# Patient Record
Sex: Female | Born: 1995 | Hispanic: Yes | Marital: Single | Smoking: Former smoker
Health system: Southern US, Community
[De-identification: ages and names within clinical notes are randomized; demographics above are authoritative.]

---
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
## 2015-01-09 ENCOUNTER — Emergency Department (HOSPITAL_COMMUNITY)
Admission: EM | Admit: 2015-01-09 | Discharge: 2015-01-09 | Disposition: A | Discharge status: Home / Self Care | Payer: Self-pay | Attending: Emergency Medicine | Admitting: Emergency Medicine

## 2015-01-09 ENCOUNTER — Encounter (HOSPITAL_COMMUNITY): Payer: Self-pay | Admitting: *Deleted

## 2015-01-09 DIAGNOSIS — Z3202 Encounter for pregnancy test, result negative: Secondary | ICD-10-CM | POA: Insufficient documentation

## 2015-01-09 DIAGNOSIS — R197 Diarrhea, unspecified: Secondary | ICD-10-CM | POA: Insufficient documentation

## 2015-01-09 DIAGNOSIS — Z720 Tobacco use: Secondary | ICD-10-CM | POA: Insufficient documentation

## 2015-01-09 DIAGNOSIS — R112 Nausea with vomiting, unspecified: Secondary | ICD-10-CM | POA: Insufficient documentation

## 2015-01-09 LAB — I-STAT BETA HCG BLOOD, ED (MC, WL, AP ONLY): I-stat hCG, quantitative: <5 m[IU]/mL (ref <5)

## 2015-01-09 LAB — URINALYSIS, ROUTINE W REFLEX MICROSCOPIC
GLUCOSE, UA: NEGATIVE mg/dL
Ketones, ur: 40 mg/dL — AB
LEUKOCYTES UA: NEGATIVE
NITRITE: NEGATIVE
PH: 6.5 (ref 5.0–8.0)
Protein, ur: NEGATIVE mg/dL
SPECIFIC GRAVITY, URINE: 1.028 (ref 1.005–1.030)
Urobilinogen, UA: 2 mg/dL — ABNORMAL HIGH (ref 0.0–1.0)

## 2015-01-09 LAB — CBC
HEMATOCRIT: 40.3 % (ref 36.0–46.0)
Hemoglobin: 13.4 g/dL (ref 12.0–15.0)
MCH: 29.6 pg (ref 26.0–34.0)
MCHC: 33.3 g/dL (ref 30.0–36.0)
MCV: 89.2 fL (ref 78.0–100.0)
Platelets: 278 10*3/uL (ref 150–400)
RBC: 4.52 MIL/uL (ref 3.87–5.11)
RDW: 13.1 % (ref 11.5–15.5)
WBC: 5.6 10*3/uL (ref 4.0–10.5)

## 2015-01-09 LAB — COMPREHENSIVE METABOLIC PANEL
ALT: 66 U/L — AB (ref 14–54)
AST: 44 U/L — ABNORMAL HIGH (ref 15–41)
Albumin: 3.7 g/dL (ref 3.5–5.0)
Alkaline Phosphatase: 55 U/L (ref 38–126)
Anion gap: 8 (ref 5–15)
BUN: 8 mg/dL (ref 6–20)
CHLORIDE: 109 mmol/L (ref 101–111)
CO2: 22 mmol/L (ref 22–32)
CREATININE: 0.73 mg/dL (ref 0.44–1.00)
Calcium: 9.3 mg/dL (ref 8.9–10.3)
GFR calc Af Amer: >60 mL/min (ref >60)
Glucose, Bld: 104 mg/dL — ABNORMAL HIGH (ref 65–99)
POTASSIUM: 3.8 mmol/L (ref 3.5–5.1)
SODIUM: 139 mmol/L (ref 135–145)
Total Bilirubin: 0.5 mg/dL (ref 0.3–1.2)
Total Protein: 7.3 g/dL (ref 6.5–8.1)

## 2015-01-09 LAB — URINE MICROSCOPIC-ADD ON

## 2015-01-09 LAB — LIPASE, BLOOD: LIPASE: 21 U/L — AB (ref 22–51)

## 2015-01-09 MED ORDER — SODIUM CHLORIDE 0.9 % IV BOLUS (SEPSIS)
1000.0000 mL | Freq: Once | INTRAVENOUS | Status: AC
  Administered 2015-01-09: 1000 mL via INTRAVENOUS

## 2015-01-09 MED ORDER — ONDANSETRON HCL 4 MG/2ML IJ SOLN
4.0000 mg | Freq: Once | INTRAMUSCULAR | Status: AC
  Administered 2015-01-09: 4 mg via INTRAVENOUS
  Filled 2015-01-09: qty 2

## 2015-01-09 MED ORDER — ONDANSETRON 4 MG PO TBDP: 4.0000 mg | ORAL_TABLET | Freq: Three times a day (TID) | ORAL | Status: DC | PRN

## 2015-01-09 NOTE — ED Provider Notes (Signed)
CSN: [id number]     Arrival date & time 01/09/15  7829 History   First MD Initiated Contact with Patient 01/09/15 [phone]     Chief Complaint  Patient presents with  . Emesis    HPI   Dana Tapia is a 19 y.o. female with no pertinent past medical history who presents to the ED with vomiting. She states she has been vomiting intermittently over the last 2 weeks, but that she has been vomiting daily since Friday. She states she wakes up at around 3 or 4 in the morning and vomits. She reports she saw streaks of blood in her emesis today. She denies fever, chills, chest pain, shortness of breath. She states she has not had anything to eat or drink over the past few days, because she cannot "keep it down." She also reports diarrhea. She denies melena or hematochezia. She denies dysuria, urgency, frequency, vaginal discharge. She denies alcohol use. She states she uses marijuana occasionally, her last use was yesterday.   History reviewed. No pertinent past medical history. History reviewed. No pertinent past surgical history. No family history on file. Social History  Substance Use Topics  . Smoking status: Current Some Day Smoker  . Smokeless tobacco: None  . Alcohol Use: No   OB History    No data available      Review of Systems  Constitutional: Negative for fever and chills.  Respiratory: Negative for shortness of breath.   Cardiovascular: Negative for chest pain.  Gastrointestinal: Positive for nausea, vomiting and diarrhea. Negative for abdominal pain, constipation and blood in stool.  Genitourinary: Negative for dysuria, urgency, frequency and vaginal discharge.  All other systems reviewed and are negative.     Allergies  Review of patient's allergies indicates no known allergies.  Home Medications   Prior to Admission medications   Medication Sig Start Date End Date Taking? Authorizing Provider  benzonatate (TESSALON) 100 MG capsule Take 1 capsule (100 mg total) by mouth  every 8 (eight) hours. 08/10/14   Ladona Mow, PA-C  ondansetron (ZOFRAN) 4 MG tablet Take 1 tablet (4 mg total) by mouth every 8 (eight) hours as needed for nausea or vomiting. 08/10/14   Ladona Mow, PA-C    BP 137/93 mmHg  Pulse 78  Temp(Src) 98.5 F (36.9 C) (Oral)  Resp 18  SpO2 98%  LMP 01/02/2015 Physical Exam  Constitutional: She is oriented to person, place, and time. She appears well-developed and well-nourished. No distress.  HENT:  Head: Normocephalic and atraumatic.  Right Ear: External ear normal.  Left Ear: External ear normal.  Nose: Nose normal.  Mouth/Throat: Uvula is midline, oropharynx is clear and moist and mucous membranes are normal.  Eyes: Conjunctivae, EOM and lids are normal. Pupils are equal, round, and reactive to light. Right eye exhibits no discharge. Left eye exhibits no discharge. No scleral icterus.  Neck: Normal range of motion. Neck supple.  Cardiovascular: Normal rate, regular rhythm, normal heart sounds, intact distal pulses and normal pulses.   Pulmonary/Chest: Effort normal and breath sounds normal. No respiratory distress. She has no wheezes. She has no rales.  Abdominal: Soft. Normal appearance and bowel sounds are normal. She exhibits no distension and no mass. There is no tenderness. There is no rigidity, no rebound and no guarding.  Musculoskeletal: Normal range of motion. She exhibits no edema or tenderness.  Neurological: She is alert and oriented to person, place, and time. She has normal strength. No cranial nerve deficit or sensory deficit.  Skin: Skin is warm, dry and intact. No rash noted. She is not diaphoretic. No erythema. No pallor.  Psychiatric: She has a normal mood and affect. Her speech is normal and behavior is normal. Judgment and thought content normal.  Nursing note and vitals reviewed.   ED Course  Procedures (including critical care time)  Labs Review Labs Reviewed  LIPASE, BLOOD - Abnormal; Notable for the following:     Lipase 21 (*)    All other components within normal limits  COMPREHENSIVE METABOLIC PANEL - Abnormal; Notable for the following:    Glucose, Bld 104 (*)    AST 44 (*)    ALT 66 (*)    All other components within normal limits  URINALYSIS, ROUTINE W REFLEX MICROSCOPIC (NOT AT [facility]) - Abnormal; Notable for the following:    Color, Urine AMBER (*)    APPearance CLOUDY (*)    Hgb urine dipstick MODERATE (*)    Bilirubin Urine SMALL (*)    Ketones, ur 40 (*)    Urobilinogen, UA 2.0 (*)    All other components within normal limits  URINE MICROSCOPIC-ADD ON - Abnormal; Notable for the following:    Squamous Epithelial / LPF MANY (*)    Bacteria, UA MANY (*)    All other components within normal limits  CBC  I-STAT BETA HCG BLOOD, ED (MC, WL, AP ONLY)    Imaging Review No results found.   I have personally reviewed and evaluated these lab results as part of my medical decision-making.   EKG Interpretation None      MDM   Final diagnoses:  Non-intractable vomiting with nausea, vomiting of unspecified type  Diarrhea, unspecified type    19 year old female presents with vomiting and diarrhea. States her vomiting started 2 weeks ago, but has been more consistent since Friday. Patient was seen for the same symptoms in May, at which time her symptoms were attributed to viral gastroenteritis.  Patient is afebrile with stable vitals. Mucous membranes are moist. Heart regular rate and rhythm. Lungs clear to auscultation bilaterally. Abdomen soft, nontender, nondistended with no rebound, guarding, or masses.    CBC negative for leukocytosis or anemia. UA with moderate hemoglobin and ketones. 3-6 RBC on microscopic. Beta hCG negative. Lipase within normal limits. CMP remarkable for AST 44, LT 66.  Will give IV fluids and zofran for nausea.  Patient able to tolerate PO trial in the ED, and reports symptom improvement with nausea medicine. Symptoms most likely viral, will treat  symptomatically with zofran. Patient to follow up with PCP in 2-3 days for repeat hepatic function panel.  BP 122/70 mmHg  Pulse 72  Temp(Src) 98.5 F (36.9 C) (Oral)  Resp 14  SpO2 100%  LMP 01/02/2015     Mady Gemma, PA-C 01/09/15 2255  Arby Barrette, MD 01/19/15 1320

## 2015-01-09 NOTE — Discharge Instructions (Signed)
1. Medications: zofran, usual home medications 2. Treatment: rest, drink plenty of fluids 3. Follow Up: please followup with your primary doctor in 2-3 days for discussion of your diagnoses and further evaluation after today's visit and for repeat hepatic function panel; if you do not have a primary care doctor use the resource guide provided to find one; please return to the ER for high fever, abdominal pain, persistent vomiting, new or worsening symptoms   Nausea and Vomiting Nausea means you feel sick to your stomach. Throwing up (vomiting) is a reflex where stomach contents come out of your mouth. HOME CARE   Take medicine as told by your doctor.  Do not force yourself to eat. However, you do need to drink fluids.  If you feel like eating, eat a normal diet as told by your doctor.  Eat rice, wheat, potatoes, bread, lean meats, yogurt, fruits, and vegetables.  Avoid high-fat foods.  Drink enough fluids to keep your pee (urine) clear or pale yellow.  Ask your doctor how to replace body fluid losses (rehydrate). Signs of body fluid loss (dehydration) include:  Feeling very thirsty.  Dry lips and mouth.  Feeling dizzy.  Dark pee.  Peeing less than normal.  Feeling confused.  Fast breathing or heart rate. GET HELP RIGHT AWAY IF:   You have blood in your throw up.  You have black or bloody poop (stool).  You have a bad headache or stiff neck.  You feel confused.  You have bad belly (abdominal) pain.  You have chest pain or trouble breathing.  You do not pee at least once every 8 hours.  You have cold, clammy skin.  You keep throwing up after 24 to 48 hours.  You have a fever. MAKE SURE YOU:   Understand these instructions.  Will watch your condition.  Will get help right away if you are not doing well or get worse. Document Released: 09/11/2007 Document Revised: 06/17/2011 Document Reviewed: 08/24/2010 [facility] Patient Information 2015 [place], [place].  This information is not intended to replace advice given to you by your health care provider. Make sure you discuss any questions you have with your health care provider.   Emergency Department Resource Guide 1) Find a Doctor and Pay Out of Pocket Although you won't have to find out who is covered by your insurance plan, it is a good idea to ask around and get recommendations. You will then need to call the office and see if the doctor you have chosen will accept you as a new patient and what types of options they offer for patients who are self-pay. Some doctors offer discounts or will set up payment plans for their patients who do not have insurance, but you will need to ask so you aren't surprised when you get to your appointment.  2) Contact Your Local Health Department Not all health departments have doctors that can see patients for sick visits, but many do, so it is worth a call to see if yours does. If you don't know where your local health department is, you can check in your phone book. The CDC also has a tool to help you locate your state's health department, and many state websites also have listings of all of their local health departments.  3) Find a Walk-in Clinic If your illness is not likely to be very severe or complicated, you may want to try a walk in clinic. These are popping up all over the country in pharmacies, drugstores, and  shopping centers. They're usually staffed by nurse practitioners or physician assistants that have been trained to treat common illnesses and complaints. They're usually fairly quick and inexpensive. However, if you have serious medical issues or chronic medical problems, these are probably not your best option.  No Primary Care Doctor: - Call Health Connect at  [phone] - they can help you locate a primary care doctor that  accepts your insurance, provides certain services, etc. - Physician Referral Service- [phone]  Chronic Pain  Problems: Organization         Address  Phone   Notes  [facility]  [phone] Patients need to be referred by their primary care doctor.   Medication Assistance: Organization         Address  Phone   Notes  [facility] Medication [facility] [address] [place], [place] [address] [phone] --Must be a resident of [facility] -- Must have NO insurance coverage whatsoever (no Medicaid/ Medicare, etc.) -- The pt. MUST have a primary care doctor that directs their care regularly and follows them in the community   Med[facility]ist  [phone]   [employer]  [phone]    Agencies that provide inexpensive medical care: Organization         Address  Phone   Notes  [place]  [phone]   [facility]    [phone]   [facility] [place], [place] [address] [phone]   [place] [address], [place] [phone]   Planned Parenthood    [phone]   [facility]    [phone]   [place] and [place] [address], [place] Phone:  [phone], Fax:  [phone] Hours of Operation:  9 am - 6 pm, M-F.  Also accepts Medicaid/Medicare and self-pay.  [facility] for [place], [address], [place] Phone: [phone], Fax: [phone]. Hours of Operation:  8:30 am - 5:30 pm, M-F.  Also accepts Medicaid and self-pay.  [facility] [address] [place] Phone: [phone]   [place], [place], [place] [phone], Ext. [phone] Mondays & Thursdays: 7-9 AM.  First 15 patients are seen on a first come, first serve basis.    [place] Providers:  Organization         Address  Phone   Notes  [facility] [address],  [phone] Also  accepts self-pay patients.  [facility] [address], [place]  [phone]   [place], [address], [place] [phone]   [facility] [address], [place] [phone]   Lucianne Lei [address], [place]   [phone] Only accepts [place] Access [place] patients after they have their name applied to their card.   Self-Pay (no insurance) in [facility]:  Organization         Address  Phone   Notes  Sickle Cell Patients, [facility] [place] [phone]   [facility] [place] [phone]   [facility] [place]  [place] [address] [place], [place] [phone]  [facility]/Dr. Osei-Bonsu  [address] [place] or [address], [place] [phone] Phone number for both [place] and [place] locations is the same.  Urgent Medical and [facility] [address] [place] [phone]   [facility] [address] [place] or [address] [phone]   [facility] [address], [place] [phone], phone; [phone], fax Sees patients 1st and 3rd Saturday of every month.  Must not qualify for public or private insurance (i.e. Medicaid, Medicare, [place] Health Choice, Veterans' Benefits)  Household income should be no more than 200% of the poverty level The clinic cannot treat you if you are pregnant or think you are pregnant  Sexually transmitted diseases are not treated at the clinic.    Dental Care: Organization         Address  Phone  Notes  [facility] Department of [facility] [place] [phone] Accepts children up to age 63 who are enrolled in [place] or [place]; pregnant  women with a Medicaid card; and children who have applied for Medicaid or [place] Health Choice, but were declined, whose parents can pay a reduced fee at time of service.  [facility] Department of [facility]  [address], [place] [phone] Accepts children up to age 22 who are enrolled in [place] or [place]; pregnant women with a Medicaid card; and children who have applied for Medicaid or [place] Health Choice, but were declined, whose parents can pay a reduced fee at time of service.  [place] Adult Dental Access PROGRAM  [place] [phone] Patients are seen by appointment only. Walk-ins are not accepted. [place] will see patients 74 years of age and older. Monday - Tuesday (8am-5pm) Most Wednesdays (8:30-5pm) $30 per visit, cash only  [facility] Adult Dental Access PROGRAM  [address], [facility] [phone] Patients are seen by appointment only. Walk-ins are not accepted. [place] will see patients 69 years of age and older. One Wednesday Evening (Monthly: Volunteer Based).  $30 per visit, cash only  [place]  [phone] for adults; Children under age 73, call Graduate Pediatric Dentistry at [phone]. Children aged 62-14, please call [phone] to request a pediatric application.  Dental services are provided in all areas of dental care including fillings, crowns and bridges, complete and partial dentures, implants, gum treatment, root canals, and extractions. Preventive care is also provided. Treatment is provided to both adults and children. Patients are selected via a lottery and there is often a waiting list.   [facility] [address] [place]  [phone] [email or web address]   [facility] [address] [place], [place] [phone], Ext. [phone] Second and Fourth Thursday of each month, opens at 6:30 AM; Clinic ends at 9 AM.  Patients are  seen on a first-come first-served basis, and a limited number are seen during each clinic.   [facility]  [address] [place]ard Danker [place], [place] [phone]   Eligibility Requirements You must have lived in [place], [place], or [place] counties for at least the last three months.   You cannot be eligible for state or federal sponsored [employer], including [employer], [place], or [employer].   You generally cannot be eligible for healthcare insurance  through your employer.    How to apply: Eligibility screenings are held every Tuesday and Wednesday afternoon from 1:00 pm until 4:00 pm. You do not need an appointment for the interview!  [facility] [address] [place], [place]   [place]  [place] Department  [place]  [phone]    Behavioral Health Resources in the Community: Intensive Outpatient Programs Organization         Address  Phone  Notes  [place]. [address], [place], [place] [phone]   [facility] [address] [place], [place]   ADS: [facility] Drug [facility] [address] [place], [place]   [place] [address]  [place], [place] or [phone]   Substance Abuse Resources Organization         Address  Phone  Notes  Alcohol and Drug Services  [phone]   [place]  [phone]   [facility] [place]   Chinita Pester  [phone]   Residential & Outpatient Substance Abuse Program  [phone]   Psychological Services Organization         Address  Phone  Notes  [facility] [place]  [place]  [phone]   [place] [address], [place] or [phone]    Mobile Crisis  Teams Organization         Address  Phone  Notes  [facility], Mobile Crisis Care Unit  [phone]   [facility]  [address] [place], [place]   [place] [address] [place] [phone]    Self-Help/Support Groups Organization         Address  Phone             Notes  [place]. of [place] - variety of support groups  [place] Call for more information  Narcotics Anonymous (NA), [facility] [address], [employer] [place]  2 meetings at this location   Special educational needs teacher         Address  Phone  Notes  [facility]AP Residential Treatment [place],    [place]  [phone]   [facility]  [address] [place] [phone], [place], [place]   [place], [place] [phone] Admissions: 8am-3pm M-F  Incentives Substance [place] [address],    [place], [place] [phone]   [facility] [address] [place], [place], [place]   [facility] [address],  [place], [place]   [facility] - Intensive Outpatient [address], [place], [place]   [facility] ([place] [place].,  [place], [place] [phone] or [phone]   [facility] ([facility]) [address], [place], [place] Accepts Medicaid  [facility] [place] [address]  [place] [phone] Substance Abuse/Addiction Treatment   [facility] Organization         Address  Phone  Notes  [facility]  [phone]   Domenic Schwab, PhD [address] [place], [place]   [phone] or [phone]   [place], [place] [phone]  [facility] [address], [place], [place] [phone]  Insurance/Medicaid/sponsorship through [facility]  °[facility] [address]                                    [place], [place] [phone] Therapy/tele-psych/case  °[facility] [address]  ° [place], [place] [phone]    °Dr. Arfeen  ([phone]   °[facility] [place]  [employer] [place] [facility] 1) [address], [place] °2) [address], [place] °3)  [address], [place] [phone] °([phone] ° °([phone]   °[place] Child Abuse Hotline (336) 342-1394 or (336) [phone] (After Hours)    ° ° ° ° °

## 2015-01-09 NOTE — ED Notes (Signed)
Pt states vomiting since Friday and states she vomited up a little blood today.  Pt reports some diarrhea.  LMP:  01/03/15.  No abdominal pain.  No urinary or vaginal concerns

## 2016-06-04 ENCOUNTER — Other Ambulatory Visit (HOSPITAL_COMMUNITY)
Admission: RE | Admit: 2016-06-04 | Discharge: 2016-06-04 | Disposition: A | Discharge status: Home / Self Care | Payer: BLUE CROSS/BLUE SHIELD | Source: Ambulatory Visit | Attending: Family Medicine | Admitting: Family Medicine

## 2016-06-04 ENCOUNTER — Other Ambulatory Visit: Payer: Self-pay | Admitting: Otolaryngology

## 2016-06-04 DIAGNOSIS — Z113 Encounter for screening for infections with a predominantly sexual mode of transmission: Secondary | ICD-10-CM | POA: Insufficient documentation

## 2016-06-10 LAB — URINE CYTOLOGY ANCILLARY ONLY
Bacterial vaginitis: NEGATIVE
CANDIDA VAGINITIS: NEGATIVE
Chlamydia: NEGATIVE
Neisseria Gonorrhea: NEGATIVE
TRICH (WINDOWPATH): NEGATIVE

## 2017-04-29 ENCOUNTER — Encounter (HOSPITAL_COMMUNITY): Payer: Self-pay | Admitting: Emergency Medicine

## 2017-04-29 ENCOUNTER — Ambulatory Visit (HOSPITAL_COMMUNITY)
Admission: EM | Admit: 2017-04-29 | Discharge: 2017-04-29 | Disposition: A | Discharge status: Home / Self Care | Payer: BLUE CROSS/BLUE SHIELD | Attending: Family Medicine | Admitting: Family Medicine

## 2017-04-29 DIAGNOSIS — O210 Mild hyperemesis gravidarum: Secondary | ICD-10-CM | POA: Diagnosis not present

## 2017-04-29 LAB — POCT URINALYSIS DIP (DEVICE)
Glucose, UA: NEGATIVE mg/dL
Ketones, ur: >=160 mg/dL — AB
Leukocytes, UA: NEGATIVE
NITRITE: NEGATIVE
Protein, ur: 30 mg/dL — AB
Specific Gravity, Urine: >=1.03 (ref 1.005–1.030)
Urobilinogen, UA: 2 mg/dL — ABNORMAL HIGH (ref 0.0–1.0)
pH: 6 (ref 5.0–8.0)

## 2017-04-29 LAB — POCT PREGNANCY, URINE: Preg Test, Ur: POSITIVE — AB

## 2017-04-29 MED ORDER — ONDANSETRON HCL 4 MG PO TABS: 4.0000 mg | ORAL_TABLET | Freq: Three times a day (TID) | ORAL | 0 refills | Status: AC | PRN

## 2017-04-29 MED ORDER — VITAMIN B-6 25 MG PO TABS: 25.0000 mg | ORAL_TABLET | Freq: Three times a day (TID) | ORAL | 0 refills | Status: AC | PRN

## 2017-04-29 MED ORDER — DOXYLAMINE SUCCINATE (SLEEP) 25 MG PO TABS: 12.5000 mg | ORAL_TABLET | Freq: Three times a day (TID) | ORAL | 0 refills | Status: AC | PRN

## 2017-04-29 NOTE — ED Triage Notes (Signed)
PT C/O: vomiting onset 1 week associated w/nauseas and decreased appetite  Pt sts she is 2 months pregnant but sts she doesn't plan to keep the baby..... Has not been seen by a medical doctor.   TAKING MEDS: none   A&O x4... NAD... Ambulatory

## 2017-04-29 NOTE — ED Provider Notes (Signed)
[facility]    CSN: [id number] Arrival date & time: 04/29/17  1412     History   Chief Complaint Chief Complaint  Patient presents with  . Emesis    HPI Dana Tapia is a 22 y.o. female.   Dana Tapia presents with complaints of nausea and vomiting which has worsened since 1/17. LMP approximately 12/3 making her approximately [redacted] weeks pregnant. She states she has an appointment in two days in [place] for termination as she does not plan on keeping this pregnancy. Denies abdominal pain, vaginal bleeding, urinary symptoms, back pain or fevers. Denies diarrhea or constipation. This is first pregnancy. She denies any current dizziness. States just occasionally when she gets up from lying she has mild dizziness. Has not taken any medications. Denies any medical history.    ROS per HPI.       History reviewed. No pertinent past medical history.  There are no active problems to display for this patient.   History reviewed. No pertinent surgical history.  OB History    No data available       Home Medications    Prior to Admission medications   Medication Sig Start Date End Date Taking? Authorizing Provider  doxylamine, Sleep, (UNISOM) 25 MG tablet Take 0.5 tablets (12.5 mg total) by mouth 3 (three) times daily as needed (nausea). 04/29/17   Georgetta HaberBurky, Mercedes Fort B, NP  ondansetron (ZOFRAN) 4 MG tablet Take 1 tablet (4 mg total) by mouth every 8 (eight) hours as needed for nausea or vomiting. 04/29/17   Georgetta HaberBurky, Frankye Schwegel B, NP  vitamin B-6 (PYRIDOXINE) 25 MG tablet Take 1 tablet (25 mg total) by mouth 3 (three) times daily as needed (nausea). 04/29/17   Georgetta HaberBurky, Aysen Shieh B, NP    Family History History reviewed. No pertinent family history.  Social History Social History   Tobacco Use  . Smoking status: Current Some Day Smoker  . Smokeless tobacco: Never Used  Substance Use Topics  . Alcohol use: No  . Drug use: Yes    Types: Marijuana     Allergies   Patient has no known  allergies.   Review of Systems Review of Systems   Physical Exam Triage Vital Signs ED Triage Vitals [04/29/17 1433]  Enc Vitals Group     BP 126/89     Pulse Rate 78     Resp 16     Temp 97.8 F (36.6 C)     Temp Source Oral     SpO2 100 %     Weight      Height      Head Circumference      Peak Flow      Pain Score      Pain Loc      Pain Edu?      Excl. in GC?    No data found.  Updated Vital Signs BP 126/89 (BP Location: Left Arm)   Pulse 78   Temp 97.8 F (36.6 C) (Oral)   Resp 16   LMP 03/12/2017   SpO2 100%   Visual Acuity Right Eye Distance:   Left Eye Distance:   Bilateral Distance:    Right Eye Near:   Left Eye Near:    Bilateral Near:     Physical Exam  Constitutional: She is oriented to person, place, and time. She appears well-developed and well-nourished. No distress.  Cardiovascular: Normal rate, regular rhythm and normal heart sounds.  Pulmonary/Chest: Effort normal and breath sounds normal.  Abdominal: Soft.  She exhibits no distension and no mass. There is no tenderness. There is no guarding.  Neurological: She is alert and oriented to person, place, and time.  Skin: Skin is warm and dry.     UC Treatments / Results  Labs (all labs ordered are listed, but only abnormal results are displayed) Labs Reviewed  POCT URINALYSIS DIP (DEVICE) - Abnormal; Notable for the following components:      Result Value   Bilirubin Urine SMALL (*)    Ketones, ur >=160 (*)    Hgb urine dipstick TRACE (*)    Protein, ur 30 (*)    Urobilinogen, UA 2.0 (*)    All other components within normal limits  POCT PREGNANCY, URINE - Abnormal; Notable for the following components:   Preg Test, Ur POSITIVE (*)    All other components within normal limits    EKG  EKG Interpretation None       Radiology No results found.  Procedures Procedures (including critical care time)  Medications Ordered in UC Medications - No data to display   Initial  Impression / Assessment and Plan / UC Course  I have reviewed the triage vital signs and the nursing notes.  Pertinent labs & imaging results that were available during my care of the patient were reviewed by me and considered in my medical decision making (see chart for details).     Non toxic in appearance. Ambulatory. Vitals stable. Urine consistent with dehydration. Discussed risks and use of zofran, 6 tabs provided. Vitamin b6 and unisom recommended, discussed small frequent snacks /sips to maintain hydration. Return precautions provided. Follow up with ob or pursue appointment on Thursday as scheduled. Patient verbalized understanding and agreeable to plan.    Final Clinical Impressions(s) / UC Diagnoses   Final diagnoses:  Morning sickness    ED Discharge Orders        Ordered    ondansetron (ZOFRAN) 4 MG tablet  Every 8 hours PRN     04/29/17 1500    doxylamine, Sleep, (UNISOM) 25 MG tablet  3 times daily PRN     04/29/17 1500    vitamin B-6 (PYRIDOXINE) 25 MG tablet  3 times daily PRN     04/29/17 1500       Controlled Substance Prescriptions [place] Controlled Substance Registry consulted? Not Applicable   Georgetta Haber, NP 04/29/17 [phone]

## 2017-04-29 NOTE — Discharge Instructions (Signed)
For morning sickness: Doxylamine is available in some over-the-counter sleeping pills (eg, Unisom Sleep Tabs): One-half of the 25 mg over-the-counter tablet or two chewable 5 mg tablets can be used off-label as an antiemetic. In addition, pyridoxine (vitamin B6) 25 mg, also available over-the-counter, can be taken three or four times per day along with 12.5 mg of doxylamine  If develop abdominal pain, dizziness, passing out, no urine output in 8hour period or otherwise worsening return to be seen or go to Er. Please establish with OB as scheduled or for first ob appointment.

## 2018-10-21 ENCOUNTER — Emergency Department (HOSPITAL_COMMUNITY)
Admission: EM | Admit: 2018-10-21 | Discharge: 2018-10-21 | Disposition: A | Discharge status: Home / Self Care | Payer: BLUE CROSS/BLUE SHIELD | Attending: Emergency Medicine | Admitting: Emergency Medicine

## 2018-10-21 ENCOUNTER — Encounter (HOSPITAL_COMMUNITY): Payer: Self-pay

## 2018-10-21 ENCOUNTER — Emergency Department (HOSPITAL_COMMUNITY): Payer: BLUE CROSS/BLUE SHIELD

## 2018-10-21 ENCOUNTER — Other Ambulatory Visit: Payer: Self-pay

## 2018-10-21 DIAGNOSIS — R000 Tachycardia, unspecified: Secondary | ICD-10-CM | POA: Insufficient documentation

## 2018-10-21 DIAGNOSIS — R0602 Shortness of breath: Secondary | ICD-10-CM | POA: Diagnosis not present

## 2018-10-21 DIAGNOSIS — R0789 Other chest pain: Secondary | ICD-10-CM | POA: Insufficient documentation

## 2018-10-21 DIAGNOSIS — Z87891 Personal history of nicotine dependence: Secondary | ICD-10-CM | POA: Insufficient documentation

## 2018-10-21 LAB — I-STAT BETA HCG BLOOD, ED (MC, WL, AP ONLY): I-stat hCG, quantitative: <5 m[IU]/mL (ref <5)

## 2018-10-21 LAB — TROPONIN I (HIGH SENSITIVITY)
Troponin I (High Sensitivity): 3 ng/L (ref <18)
Troponin I (High Sensitivity): 3 ng/L (ref <18)

## 2018-10-21 LAB — CBC
HCT: 40.8 % (ref 36.0–46.0)
Hemoglobin: 13.4 g/dL (ref 12.0–15.0)
MCH: 30.2 pg (ref 26.0–34.0)
MCHC: 32.8 g/dL (ref 30.0–36.0)
MCV: 92.1 fL (ref 80.0–100.0)
Platelets: 313 10*3/uL (ref 150–400)
RBC: 4.43 MIL/uL (ref 3.87–5.11)
RDW: 12.9 % (ref 11.5–15.5)
WBC: 8.9 10*3/uL (ref 4.0–10.5)
nRBC: 0 % (ref 0.0–0.2)

## 2018-10-21 LAB — BASIC METABOLIC PANEL
Anion gap: 7 (ref 5–15)
BUN: 10 mg/dL (ref 6–20)
CO2: 22 mmol/L (ref 22–32)
Calcium: 9.4 mg/dL (ref 8.9–10.3)
Chloride: 110 mmol/L (ref 98–111)
Creatinine, Ser: 0.72 mg/dL (ref 0.44–1.00)
GFR calc Af Amer: >60 mL/min (ref >60)
GFR calc non Af Amer: >60 mL/min (ref >60)
Glucose, Bld: 105 mg/dL — ABNORMAL HIGH (ref 70–99)
Potassium: 3.6 mmol/L (ref 3.5–5.1)
Sodium: 139 mmol/L (ref 135–145)

## 2018-10-21 LAB — TSH: TSH: 0.738 u[IU]/mL (ref 0.350–4.500)

## 2018-10-21 MED ORDER — SODIUM CHLORIDE 0.9% FLUSH: 3.0000 mL | Freq: Once | INTRAVENOUS | Status: DC

## 2018-10-21 NOTE — ED Provider Notes (Signed)
[facility] Provider Note   CSN: [id number] Arrival date & time: 10/21/18  1753    History   Chief Complaint Chief Complaint  Patient presents with  . Chest Pain    HPI Dana Tapia is a 23 y.o. female who presents the emergency department with a chief complaint of racing heart.  Patient states that she has noticed intermittent racing heart for several days It seems to come and go without provocation. Today while in the car her heart began to race. She felt some mild, sharp, twinging chest pain and she felt sob. Her sxs lasted for approximately 10 minutes.She denies caffeine use or drugs. She has not had any fever. She denies overwhelming sense of dread or doom and has not been feeling anxious. She denies pleuritic cp, UL leg swelling. She does not smoke. Patient has no [facility]     HPI  History reviewed. No pertinent past medical history.  There are no active problems to display for this patient.   History reviewed. No pertinent surgical history.   OB History   No obstetric history on file.      Home Medications    Prior to Admission medications   Medication Sig Start Date End Date Taking? Authorizing Provider  doxylamine, Sleep, (UNISOM) 25 MG tablet Take 0.5 tablets (12.5 mg total) by mouth 3 (three) times daily as needed (nausea). Patient not taking: Reported on 10/21/2018 04/29/17   Augusto Gamble B, NP  ondansetron (ZOFRAN) 4 MG tablet Take 1 tablet (4 mg total) by mouth every 8 (eight) hours as needed for nausea or vomiting. Patient not taking: Reported on 10/21/2018 04/29/17   Zigmund Gottron, NP  vitamin B-6 (PYRIDOXINE) 25 MG tablet Take 1 tablet (25 mg total) by mouth 3 (three) times daily as needed (nausea). Patient not taking: Reported on 10/21/2018 04/29/17   Zigmund Gottron, NP    Family History History reviewed. No pertinent family history.  Social History Social History   Tobacco Use  . Smoking status: Former Research scientist (life sciences)  .  Smokeless tobacco: Never Used  Substance Use Topics  . Alcohol use: Yes    Comment: occ  . Drug use: Yes    Types: Marijuana     Allergies   Patient has no known allergies.   Review of Systems Review of Systems Ten systems reviewed and are negative for acute change, except as noted in the HPI.    Physical Exam Updated Vital Signs BP 132/77   Pulse 74   Temp 98.2 F (36.8 C) (Oral)   Resp (!) 21   LMP 10/21/2018 (Exact Date)   SpO2 100%   Physical Exam Vitals signs and nursing note reviewed.  Constitutional:      General: She is not in acute distress.    Appearance: She is well-developed. She is not diaphoretic.  HENT:     Head: Normocephalic and atraumatic.  Eyes:     General: No scleral icterus.    Conjunctiva/sclera: Conjunctivae normal.  Neck:     Musculoskeletal: Normal range of motion.     Thyroid: No thyromegaly.  Cardiovascular:     Rate and Rhythm: Normal rate and regular rhythm.     Heart sounds: Normal heart sounds. No murmur. No friction rub. No gallop.   Pulmonary:     Effort: Pulmonary effort is normal. No respiratory distress.     Breath sounds: Normal breath sounds.  Abdominal:     General: Bowel sounds are normal. There is  no distension.     Palpations: Abdomen is soft. There is no mass.     Tenderness: There is no abdominal tenderness. There is no guarding.  Skin:    General: Skin is warm and dry.  Neurological:     Mental Status: She is alert and oriented to person, place, and time.  Psychiatric:        Behavior: Behavior normal.      ED Treatments / Results  Labs (all labs ordered are listed, but only abnormal results are displayed) Labs Reviewed  BASIC METABOLIC PANEL - Abnormal; Notable for the following components:      Result Value   Glucose, Bld 105 (*)    All other components within normal limits  CBC  TSH  I-STAT BETA HCG BLOOD, ED ([facility], [facility], [facility] ONLY)  TROPONIN I (HIGH SENSITIVITY)  TROPONIN I (HIGH SENSITIVITY)     EKG EKG Interpretation  Date/Time:  Wednesday October 21 2018 18:02:00 EDT Ventricular Rate:  101 PR Interval:  146 QRS Duration: 76 QT Interval:  354 QTC Calculation: 459 R Axis:   92 Text Interpretation:  Sinus tachycardia Rightward axis Nonspecific ST and T wave abnormality Abnormal ECG No previous ECGs available Confirmed by Richardean CanalYao, David H [id number]) on 10/21/2018 9:56:49 PM   Radiology Dg Chest 2 View  Result Date: 10/21/2018 CLINICAL DATA:  Chest pain for 2 weeks, worsening today. Difficulty catching her breath. EXAM: CHEST - 2 VIEW COMPARISON:  None. FINDINGS: Normal heart, mediastinum and hila. Clear lungs.  No pleural effusion or pneumothorax. Skeletal structures within normal limits. IMPRESSION: Normal chest radiographs. Electronically Signed   By: Amie Portlandavid  Ormond M.D.   On: 10/21/2018 18:52    Procedures Procedures (including critical care time)  Medications Ordered in ED Medications  sodium chloride flush (NS) 0.9 % injection 3 mL (has no administration in time range)     Initial Impression / Assessment and Plan / ED Course  I have reviewed the triage vital signs and the nursing notes.  Pertinent labs & imaging results that were available during my care of the patient were reviewed by me and considered in my medical decision making (see chart for details).        Patient here with c/o intermittent racing heart symptoms.  Differential diagnosis includes tacky arrhythmias including SVT, eye embolus, sinus tachycardia, panic attacks.  Patient has no active chest pain.  I have reviewed the patient's labs which show a normal thyroid-stimulating hormone level, negative beta hCG, normal troponin high-sensitivity level, CBC without abnormality, BMP shows a slightly elevated blood glucose, EKG shows sinus tachycardia without acute arrhythmias.  I discussed the differential diagnosis and work-up with the patient.  She may be having intermittent episodes of SVT and is advised to follow  closely with primary care physician cardiology for Holter monitoring.  She has had no significant events at this point.  No evidence of Brugada or WPW on her EKG.  She is not having symptoms of presyncope for she has no history of sudden cardiac death in her family.  I discussed return precautions and she is appropriate for discharge at this time   Final Clinical Impressions(s) / ED Diagnoses   Final diagnoses:  Racing heart beat    ED Discharge Orders    None       Arthor CaptainHarris, Elenor Wildes, PA-C 10/21/18 2340    Charlynne PanderYao, David Hsienta, MD 10/30/18 1256

## 2018-10-21 NOTE — ED Notes (Signed)
Up to the br 

## 2018-10-21 NOTE — ED Notes (Signed)
Episodes of a  Rapid pulse for 2 weeks  Some minor chest pain with it  heartb rate is bormal at present

## 2018-10-21 NOTE — ED Triage Notes (Signed)
Onset 2 weeks fast heart rate occurring everyday.  Onset today, while driving, pt started having fast heart rate, chest pain, breathing fast.

## 2018-10-21 NOTE — Discharge Instructions (Addendum)
Get help right away if: You have chest pain. You have shortness of breath or extreme difficulty breathing. You develop a very fast heartbeat that does not get better. You develop dizziness that does not go away. You faint or constantly feel like you are about to faint.

## 2020-07-24 IMAGING — CR CHEST - 2 VIEW
2 series · 2 of 2 positions shown · non-contrast
Comparison: None.

CLINICAL DATA: Chest pain for 2 weeks, worsening today. Difficulty
catching her breath.

EXAM:
CHEST - 2 VIEW

[chest pa]
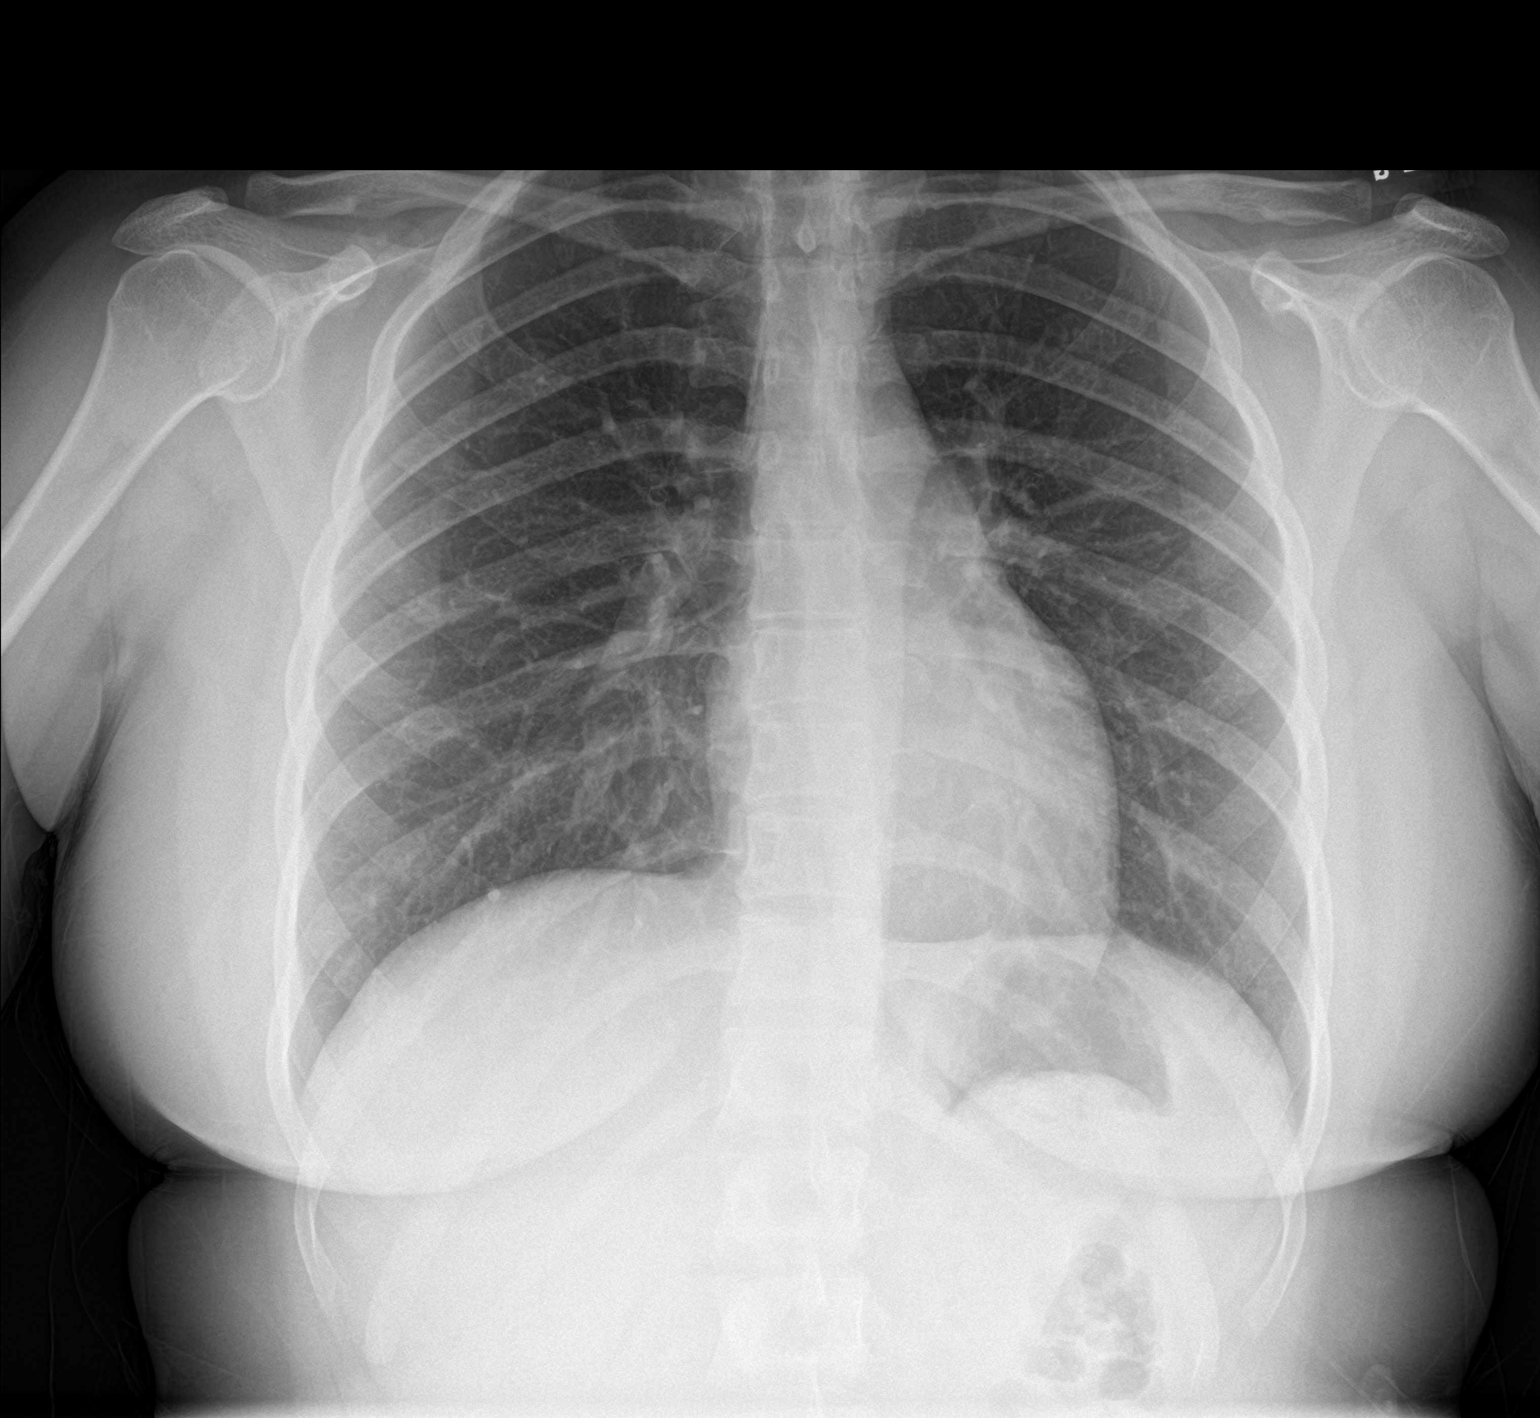

[chest lat]
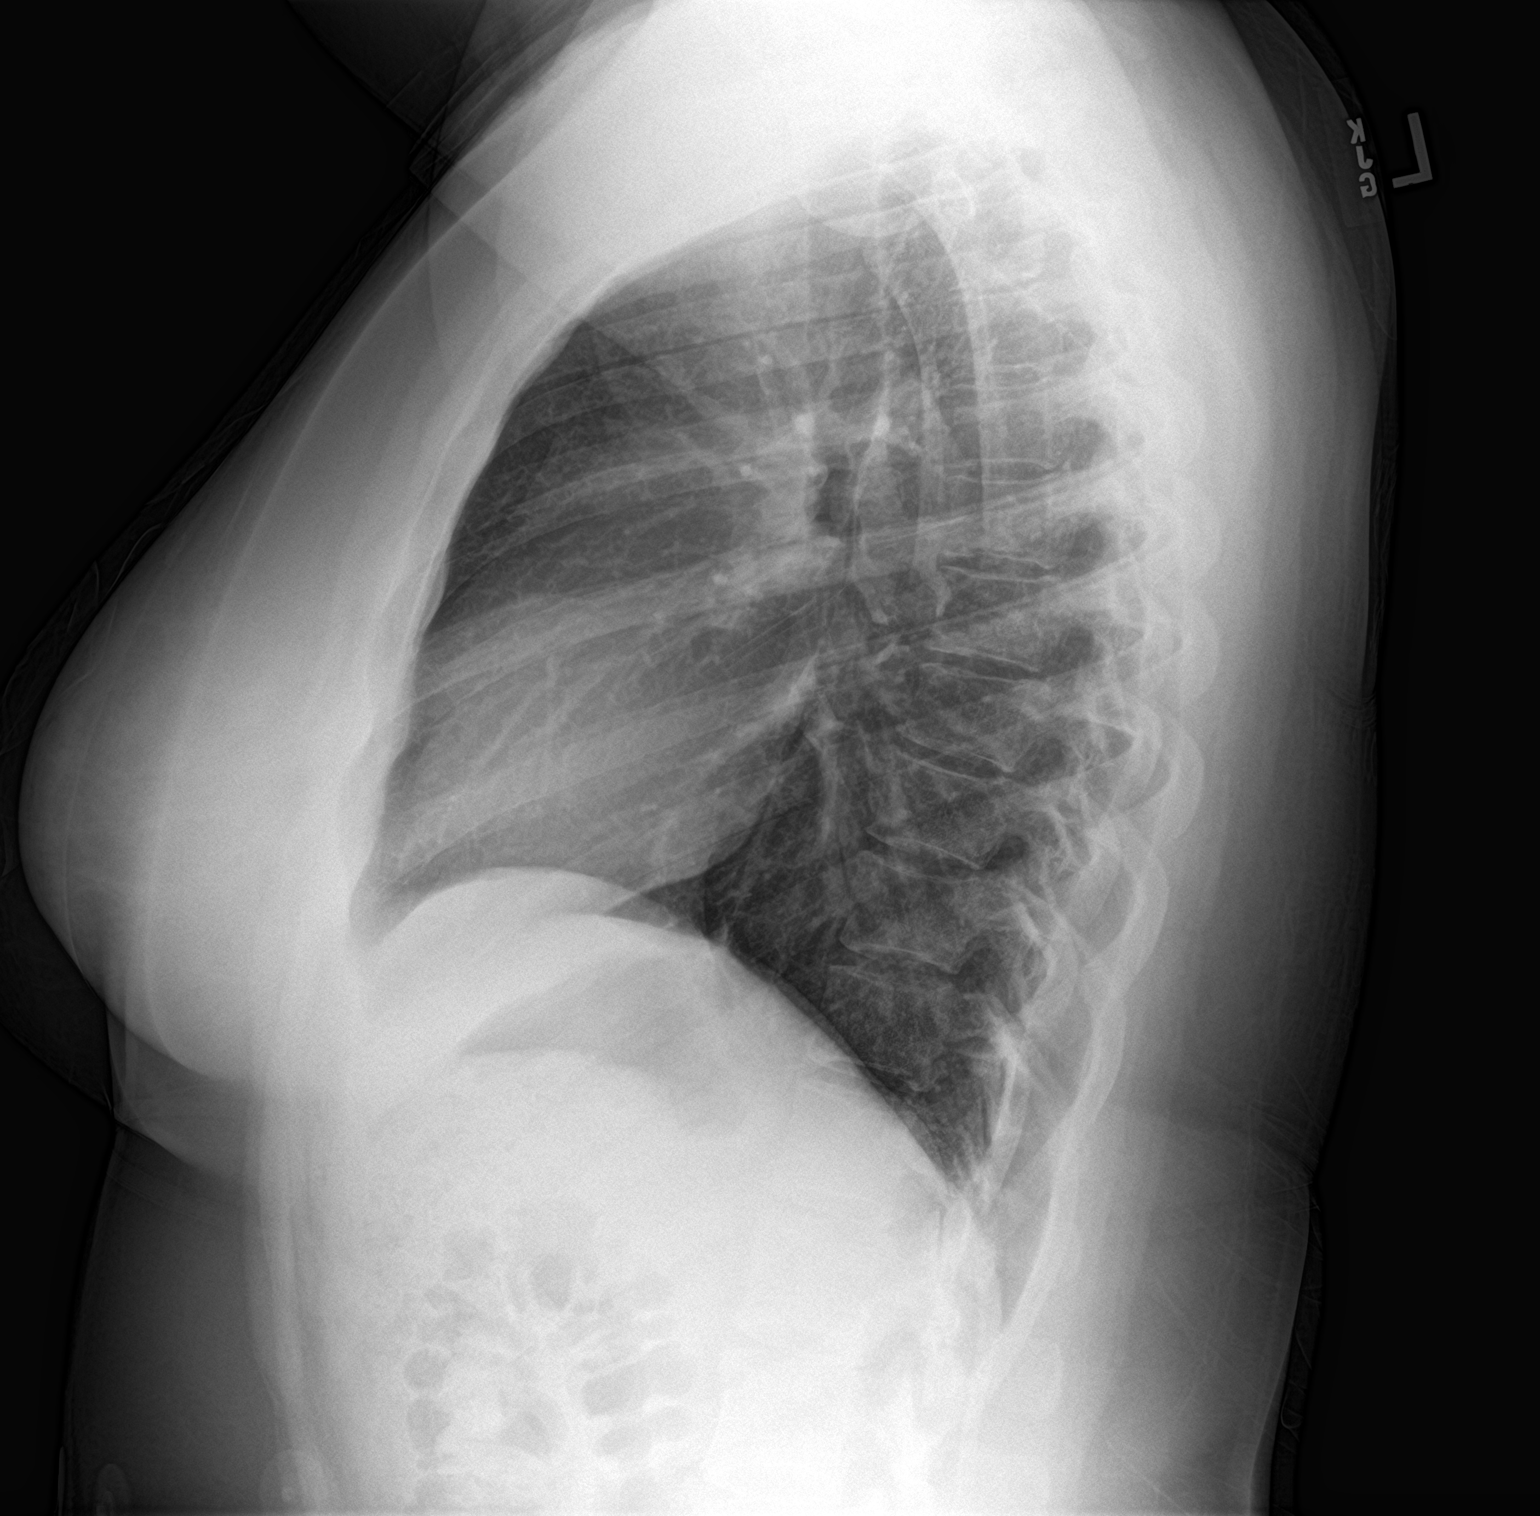

[2 of 2 positions shown; findings below may reference images not displayed]

FINDINGS: Normal heart, mediastinum and hila.

Clear lungs.  No pleural effusion or pneumothorax.

Skeletal structures within normal limits.
IMPRESSION: Normal chest radiographs.
